# Patient Record
Sex: Female | Born: 1950
Health system: Southern US, Community
[De-identification: ages and names within clinical notes are randomized; demographics above are authoritative.]

## PROBLEM LIST (undated history)

## (undated) DIAGNOSIS — F329 Major depressive disorder, single episode, unspecified: Secondary | ICD-10-CM

## (undated) DIAGNOSIS — E7800 Pure hypercholesterolemia, unspecified: Secondary | ICD-10-CM

## (undated) DIAGNOSIS — F32A Depression, unspecified: Secondary | ICD-10-CM

## (undated) DIAGNOSIS — C541 Malignant neoplasm of endometrium: Secondary | ICD-10-CM

## (undated) HISTORY — PX: OTHER SURGICAL HISTORY: SHX169

## (undated) HISTORY — DX: Major depressive disorder, single episode, unspecified: F32.9

## (undated) HISTORY — DX: Depression, unspecified: F32.A

## (undated) HISTORY — DX: Malignant neoplasm of endometrium: C54.1

## (undated) HISTORY — DX: Pure hypercholesterolemia, unspecified: E78.00

---

## 2009-11-09 DIAGNOSIS — C541 Malignant neoplasm of endometrium: Secondary | ICD-10-CM

## 2009-11-09 HISTORY — PX: DILATION AND CURETTAGE OF UTERUS: SHX78

## 2009-11-09 HISTORY — PX: ABDOMINAL HYSTERECTOMY: SHX81

## 2009-11-09 HISTORY — DX: Malignant neoplasm of endometrium: C54.1

## 2010-07-23 ENCOUNTER — Ambulatory Visit: Admission: RE | Admit: 2010-07-23 | Discharge: 2010-07-23 | Payer: Self-pay | Admitting: Gynecologic Oncology

## 2010-08-05 ENCOUNTER — Ambulatory Visit (HOSPITAL_COMMUNITY): Admission: RE | Admit: 2010-08-05 | Discharge: 2010-08-07 | Payer: Self-pay | Admitting: Obstetrics & Gynecology

## 2010-08-05 ENCOUNTER — Encounter: Payer: Self-pay | Admitting: Obstetrics & Gynecology

## 2010-09-18 ENCOUNTER — Ambulatory Visit
Admission: RE | Admit: 2010-09-18 | Discharge: 2010-09-18 | Payer: Self-pay | Source: Home / Self Care | Admitting: Gynecologic Oncology

## 2010-11-09 DIAGNOSIS — E7800 Pure hypercholesterolemia, unspecified: Secondary | ICD-10-CM

## 2010-11-09 HISTORY — DX: Pure hypercholesterolemia, unspecified: E78.00

## 2011-01-22 LAB — DIFFERENTIAL
Basophils Absolute: 0 10*3/uL (ref 0.0–0.1)
Eosinophils Absolute: 0 10*3/uL (ref 0.0–0.7)
Eosinophils Relative: 0 % (ref 0–5)
Eosinophils Relative: 1 % (ref 0–5)
Lymphocytes Relative: 24 % (ref 12–46)
Lymphocytes Relative: 24 % (ref 12–46)
Lymphs Abs: 1.9 10*3/uL (ref 0.7–4.0)
Lymphs Abs: 2 10*3/uL (ref 0.7–4.0)
Lymphs Abs: 2.3 10*3/uL (ref 0.7–4.0)
Monocytes Absolute: 0.8 10*3/uL (ref 0.1–1.0)
Monocytes Relative: 9 % (ref 3–12)
Neutro Abs: 6.3 10*3/uL (ref 1.7–7.7)
Neutrophils Relative %: 68 % (ref 43–77)

## 2011-01-22 LAB — CBC
HCT: 37.1 % (ref 36.0–46.0)
Hemoglobin: 12.7 g/dL (ref 12.0–15.0)
Hemoglobin: 13.3 g/dL (ref 12.0–15.0)
MCH: 29.1 pg (ref 26.0–34.0)
MCH: 29.3 pg (ref 26.0–34.0)
MCH: 29.5 pg (ref 26.0–34.0)
MCHC: 33.9 g/dL (ref 30.0–36.0)
MCHC: 33.9 g/dL (ref 30.0–36.0)
MCHC: 34.2 g/dL (ref 30.0–36.0)
MCV: 85.8 fL (ref 78.0–100.0)
MCV: 86.4 fL (ref 78.0–100.0)
Platelets: 231 10*3/uL (ref 150–400)
Platelets: 249 10*3/uL (ref 150–400)
Platelets: 285 10*3/uL (ref 150–400)
RDW: 13.9 % (ref 11.5–15.5)
RDW: 13.9 % (ref 11.5–15.5)
RDW: 14.4 % (ref 11.5–15.5)
WBC: 9.5 10*3/uL (ref 4.0–10.5)

## 2011-01-22 LAB — TYPE AND SCREEN
ABO/RH(D): A POS
Antibody Screen: NEGATIVE

## 2011-01-22 LAB — BASIC METABOLIC PANEL
CO2: 28 mEq/L (ref 19–32)
Glucose, Bld: 133 mg/dL — ABNORMAL HIGH (ref 70–99)
Potassium: 4.1 mEq/L (ref 3.5–5.1)
Sodium: 141 mEq/L (ref 135–145)

## 2011-01-22 LAB — SURGICAL PCR SCREEN
MRSA, PCR: INVALID — AB
Staphylococcus aureus: INVALID — AB

## 2011-01-22 LAB — COMPREHENSIVE METABOLIC PANEL
AST: 24 U/L (ref 0–37)
Albumin: 3.4 g/dL — ABNORMAL LOW (ref 3.5–5.2)
Alkaline Phosphatase: 99 U/L (ref 39–117)
BUN: 9 mg/dL (ref 6–23)
CO2: 27 mEq/L (ref 19–32)
Calcium: 8.4 mg/dL (ref 8.4–10.5)
Calcium: 9 mg/dL (ref 8.4–10.5)
Creatinine, Ser: 0.68 mg/dL (ref 0.4–1.2)
GFR calc Af Amer: >60 mL/min (ref >60)
GFR calc non Af Amer: >60 mL/min (ref >60)
Potassium: 3.7 mEq/L (ref 3.5–5.1)
Total Protein: 6.3 g/dL (ref 6.0–8.3)
Total Protein: 7.3 g/dL (ref 6.0–8.3)

## 2011-02-08 IMAGING — CR DG CHEST 2V
2 series · 2 of 2 positions shown · non-contrast
Comparison: None.

CLINICAL DATA: Preoperative evaluation for endometrial carcinoma.
Nonsmoker.  No complaints

CHEST - 2 VIEW

[w chest pa]
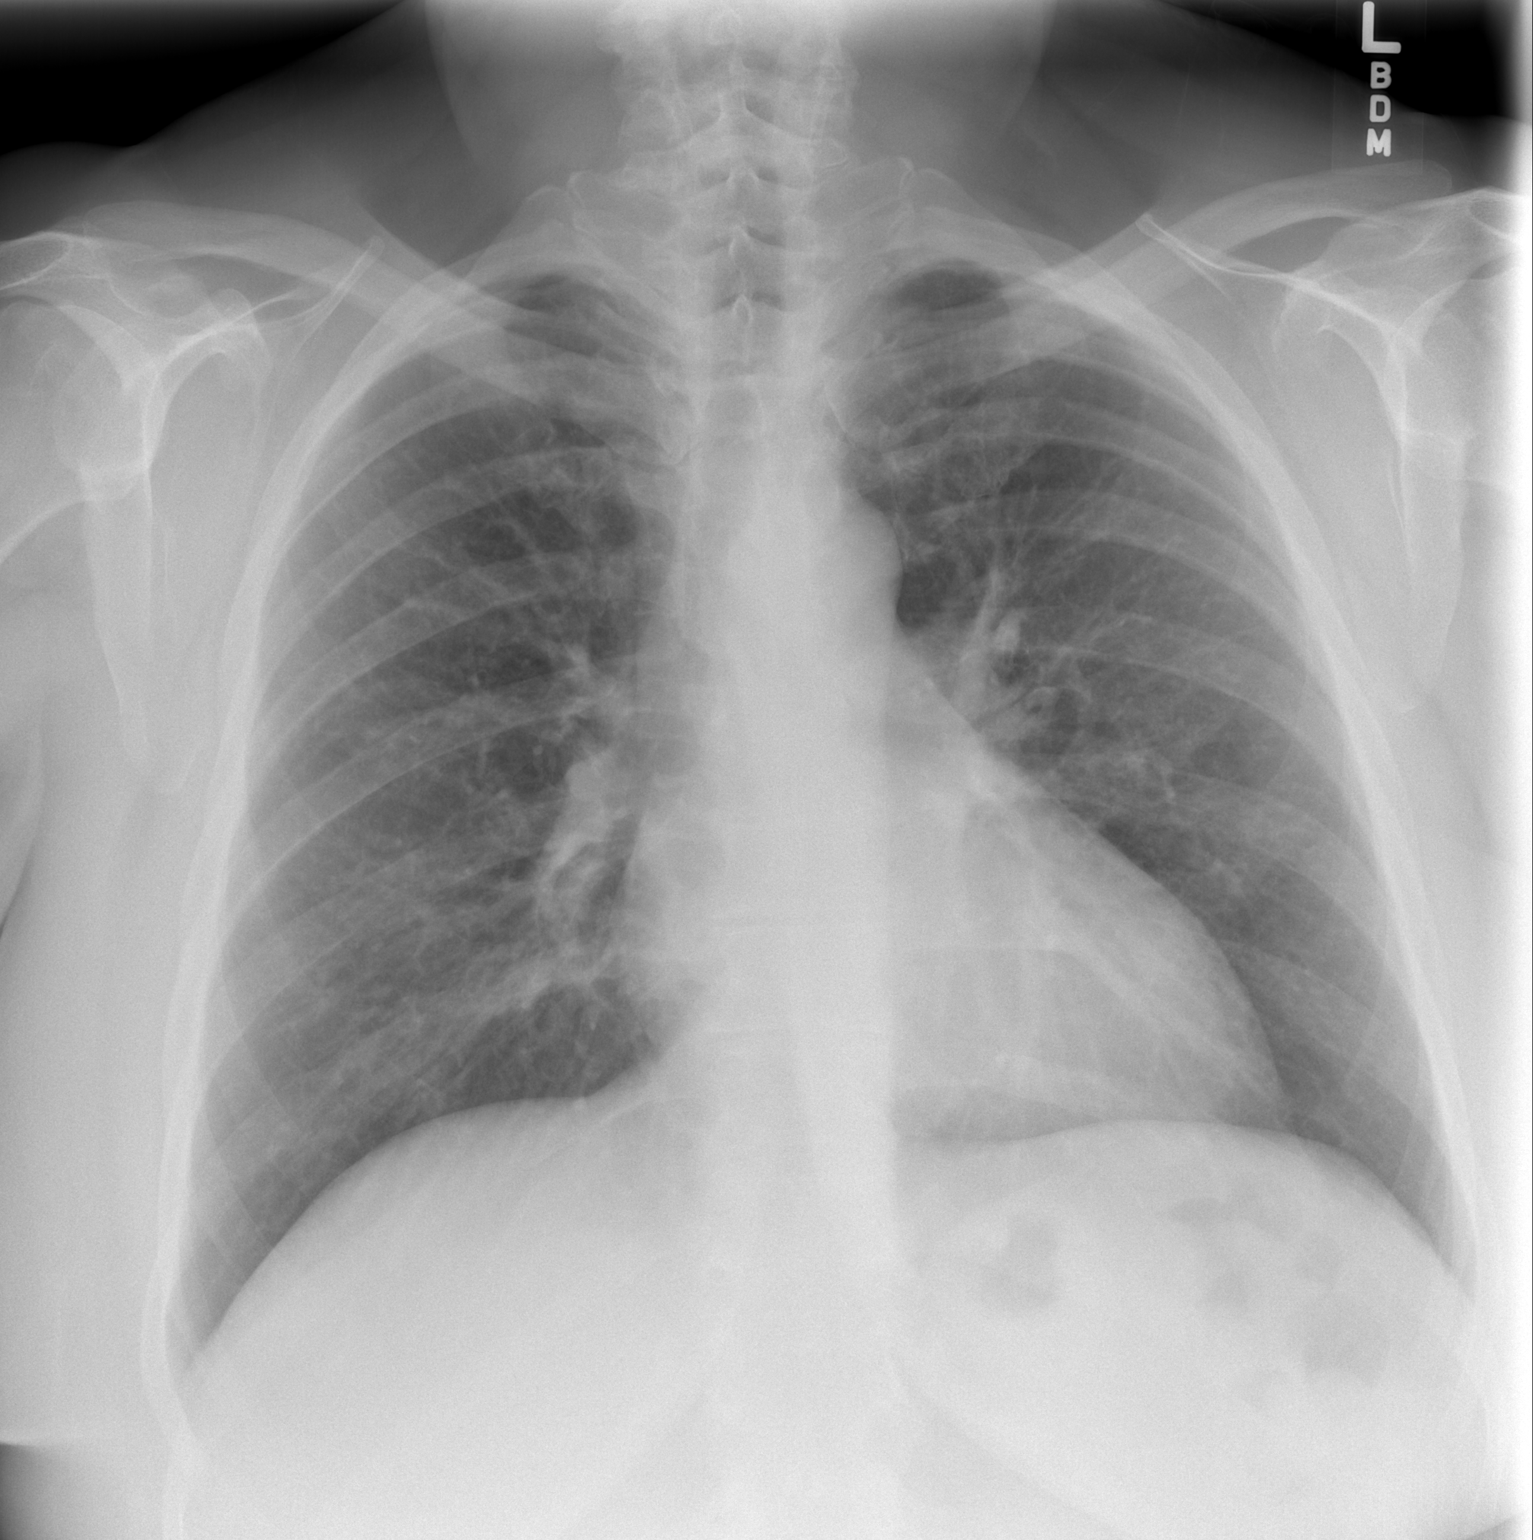

[w chest lat]
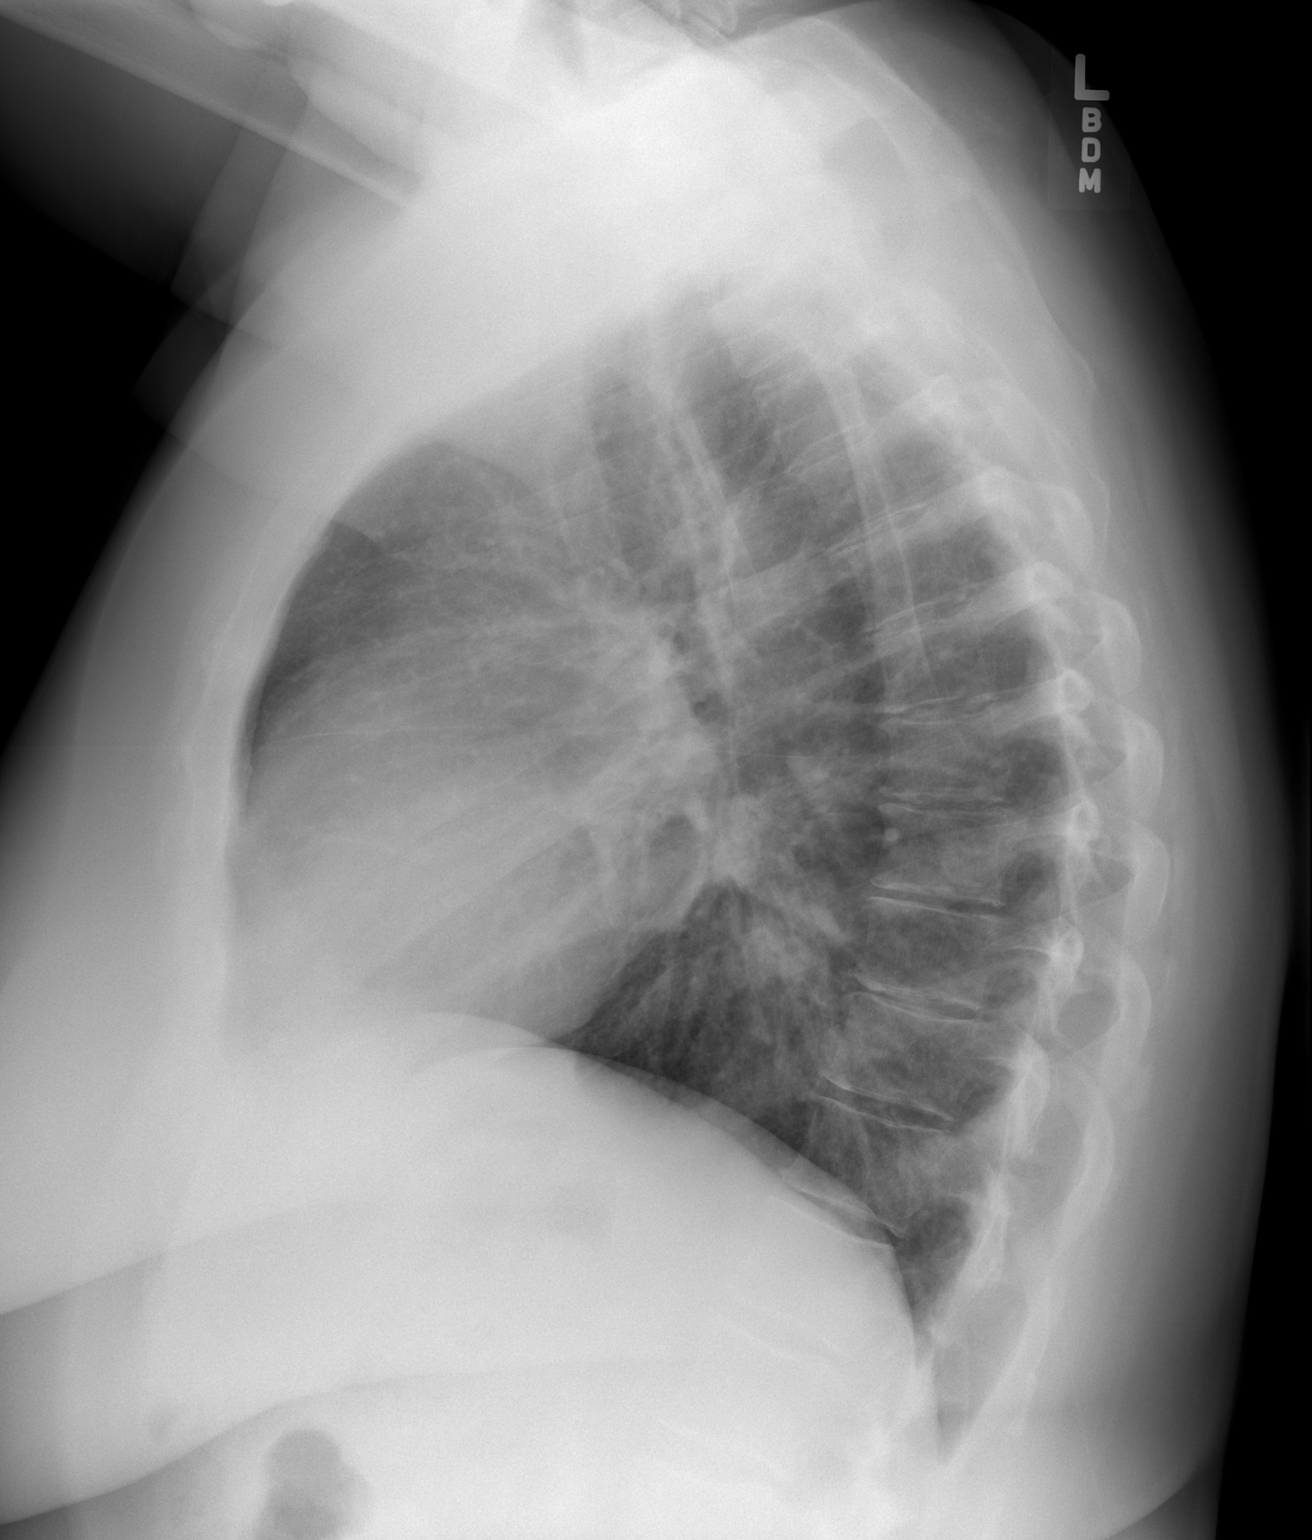

[2 of 2 positions shown; findings below may reference images not displayed]

FINDINGS: Heart size is within normal limits. Some prominence of
the central pulmonary vasculature is seen with some apical
pulmonary vascular redistribution and this can be seen with
pulmonary venous hypertension.  The lung fields appear clear with
no signs of focal infiltrate or congestive failure.  No pleural
effusion or peribronchial cuffing is seen.

Bony structures appear intact.
IMPRESSION: Findings suggesting the possibility of pulmonary venous
hypertension.  Otherwise unremarkable.

## 2011-04-08 ENCOUNTER — Other Ambulatory Visit: Payer: Self-pay | Admitting: Gynecologic Oncology

## 2011-04-08 ENCOUNTER — Ambulatory Visit: Payer: BC Managed Care – PPO | Attending: Gynecologic Oncology | Admitting: Gynecologic Oncology

## 2011-04-08 DIAGNOSIS — Z90710 Acquired absence of both cervix and uterus: Secondary | ICD-10-CM | POA: Insufficient documentation

## 2011-04-08 DIAGNOSIS — L918 Other hypertrophic disorders of the skin: Secondary | ICD-10-CM | POA: Insufficient documentation

## 2011-04-08 DIAGNOSIS — Z9079 Acquired absence of other genital organ(s): Secondary | ICD-10-CM | POA: Insufficient documentation

## 2011-04-08 DIAGNOSIS — C549 Malignant neoplasm of corpus uteri, unspecified: Secondary | ICD-10-CM | POA: Insufficient documentation

## 2011-04-09 NOTE — Consult Note (Signed)
NAME:  Hannah Barry, Hannah Barry                  ACCOUNT NO.:  [id number]  MEDICAL RECORD NO.:  [id number]           PATIENT TYPE:  O  LOCATION:  GYN                          FACILITY:  [facility]  PHYSICIAN:  Virgilene Stryker A. Duard Brady, MD    DATE OF BIRTH:  [birth year]  DATE OF CONSULTATION:  04/08/2011 DATE OF DISCHARGE:                                CONSULTATION   HISTORY:  Ms. Spoerl is a very pleasant 60 year old with stage IA1 endometrial carcinoma.  She presented to Dr. Leota Jacobsen with heavy postmenopausal bleeding in July 2011.  Ultrasound at that time revealed a 17-mm endometrial stripe thickness and she underwent a D and C which revealed a grade 1 tumor.  On August 05, 2010, she underwent robotic hysterectomy, BSO, lysis of adhesions and bilateral pelvic lymph node dissection.  Pathology was consistent with a grade 1 cancer with superficial myometrial invasion of 10% as it was 0.2 cm out of 2.5 cm in thickness; 13 lymph nodes were negative.  There was a benign endocervical polyp.  She was seen for her postoperative check in November 2011.  At that time, there was a separation of the vaginal cuff which was very slight, it was of the mucosa only and she was followed conservatively and did well.  She comes in today for her first cancer surveillance visit.  She is overall doing quite well and denies any complaints.  REVIEW OF SYSTEMS:  She denies any change in bowel or bladder habits; any nausea, vomiting, fevers, chills, chest pain, shortness of breath, vaginal bleeding, abdominal or pelvic pain.  FAMILY HISTORY:  Reviewed and there are no changes.  MEDICATIONS:  Over-the-counter allergy medications.  HEALTH MAINTENANCE:  She is due for mammogram in June.  PHYSICAL EXAMINATION:  VITAL SIGNS:  Weight 253 pounds which is up 5 pounds from her last visit, height 5 feet 7.5, blood pressure 112/72, pulse 70, respirations 16, temperature 98. GENERAL:  Well-nourished, well-developed female, in no acute  distress. NECK:  Supple.  There is no lymphadenopathy, no thyromegaly. LUNGS:  Clear to auscultation bilaterally. CARDIOVASCULAR EXAM:  Regular rate and rhythm. ABDOMEN:  Morbidly obese, soft, nontender, nondistended.  She has well- healed robotic incisions.  There is no evidence of any incisional hernias.  Groins are negative for adenopathy. EXTREMITIES:  There are no edema.  She has no chronic venous stasis changes. PELVIC:  External genitalia is within normal limits.  Vagina is atrophic.  The vaginal cuff, there is a small area of granulation tissue on the right side of the cuff.  After obtaining the patient's verbal consent, the area was biopsied without difficulty with a tissue biopsy forceps.  Hemostasis was obtained using silver nitrate.  Bimanual examination reveals no masses or nodularity.  ASSESSMENT:  The patient is a 60 year old with a stage IA, grade 1 endometrioid adenocarcinoma diagnosed and treated in September of 2011 who clinically has no evidence of recurrent disease.  I believe the granulation tissue is secondary to her superficial vaginal cuff separation.  PLAN: 1. Will follow up on results of biopsy report from today.  Will call  her with the results. 2. She will follow up with Dr. Leota Jacobsen in 6 months.  She will need a     Pap smear at that time.  Then return to see [place] in 1 year. 3. She will follow up with her other health care providers as     scheduled.     Alyzah Pelly A. Duard Brady, MD     PAG/MEDQ  D:  04/08/2011  T:  04/08/2011  Job:  [id number]  cc:   Telford Nab, R.N. [address] [place], [place] [address]  Hassell Done Fax: [phone]  Electronically Signed by Cleda Mccreedy MD on 04/09/2011 09:45:39 AM

## 2012-04-28 ENCOUNTER — Ambulatory Visit: Payer: BC Managed Care – PPO | Attending: Gynecologic Oncology | Admitting: Gynecologic Oncology

## 2012-04-28 ENCOUNTER — Encounter: Payer: Self-pay | Admitting: Gynecologic Oncology

## 2012-04-28 VITALS — BP 120/62 | HR 64 | Temp 98.0°F | Resp 16 | Ht 67.0 in | Wt 248.5 lb

## 2012-04-28 DIAGNOSIS — F329 Major depressive disorder, single episode, unspecified: Secondary | ICD-10-CM | POA: Insufficient documentation

## 2012-04-28 DIAGNOSIS — Z8542 Personal history of malignant neoplasm of other parts of uterus: Secondary | ICD-10-CM | POA: Insufficient documentation

## 2012-04-28 DIAGNOSIS — Z90710 Acquired absence of both cervix and uterus: Secondary | ICD-10-CM | POA: Insufficient documentation

## 2012-04-28 DIAGNOSIS — Z09 Encounter for follow-up examination after completed treatment for conditions other than malignant neoplasm: Secondary | ICD-10-CM | POA: Insufficient documentation

## 2012-04-28 DIAGNOSIS — F3289 Other specified depressive episodes: Secondary | ICD-10-CM | POA: Insufficient documentation

## 2012-04-28 DIAGNOSIS — C541 Malignant neoplasm of endometrium: Secondary | ICD-10-CM

## 2012-04-28 NOTE — Progress Notes (Signed)
Consult Note: Gyn-Onc  Hannah Barry 61 y.o. female  CC:  Chief Complaint  Patient presents with  . Endometrial cancer    Follow up    HPI: Hannah Barry is a very pleasant 61 year old with stage IA1 endometrial carcinoma. She presented to Dr. Leota Jacobsen with heavy postmenopausal bleeding in July 2011. Ultrasound at that time revealed a 17-mm endometrial stripe thickness and she underwent a D and C which revealed a grade 1 tumor. On August 05, 2010, she underwent robotic hysterectomy, BSO, lysis of adhesions and bilateral pelvic lymph node dissection. Pathology was consistent with a grade 1 cancer with superficial myometrial invasion of 10% as it was 0.2 cm out of 2.5 cm in thickness; 13 lymph nodes were negative. There was a benign endocervical polyp. She was seen for her postoperative check in November 2011. At that time, there was a separation of the vaginal cuff which was very slight, it was of the mucosa only and she was followed conservatively and did well. She comes in today for her cancer surveillance visit. She is overall doing quite well and denies any complaints.   REVIEW OF SYSTEMS: She denies any change in bowel or bladder habits; any nausea, vomiting, fevers, chills, chest pain, shortness of breath, vaginal bleeding, abdominal or pelvic pain. 10 point review of systems is negative.  FAMILY HISTORY: Reviewed and there are no changes. Her brother committed suicide last year.   HEALTH MAINTENANCE: She had her mammogram with Dr. Leota Jacobsen last year.  Interval History: She had some issues with depression last year in the setting of her diagnosis, her brother committing suicide, and her daughter getting divorced. Her daughter moved in with them.  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 She started taking antidepressants really has been feeling much better. She's also walking her dogs regularly  And that also helps to make her feel better. She states that compared to last her she's  feeling exceptionally well.   Current Meds:  Outpatient Encounter Prescriptions as of 04/28/2012  Medication Sig Dispense Refill  . FLUoxetine (PROZAC) 40 MG capsule Take 40 mg by mouth daily.        Allergy: No Known Allergies  Social Hx:   History   Social History  . Marital Status: Married    Spouse Name: N/A    Number of Children: N/A  . Years of Education: N/A   Occupational History  . Not on file.   Social History Main Topics  . Smoking status: Never Smoker   . Smokeless tobacco: Not on file  . Alcohol Use: No  . Drug Use: No  . Sexually Active: No   Other Topics Concern  . Not on file   Social History Narrative  . No narrative on file    Past Surgical Hx:  Past Surgical History  Procedure Date  . Abdominal hysterectomy 2011    Robotic hyst BSO, lysis of adhesions, & LND  . Dilation and curettage of uterus 2011  . Hysteroscopy     Past Medical Hx:  Past Medical History  Diagnosis Date  . Endometrial cancer 2011  . Depression   . Hypercholesterolemia 2012    Family Hx: No family history on file.  Vitals:  Blood pressure 120/62, pulse 64, temperature 98 F (36.7 C), temperature source Oral, resp. rate 16, height 5\' 7"  (1.702 m), weight 248 lb 8 oz (112.719 kg).  Physical Exam:  GENERAL: Well-nourished, well-developed female, in no acute distress.  NECK: Supple. There is  no lymphadenopathy, no thyromegaly.   LUNGS: Clear to auscultation bilaterally.   CARDIOVASCULAR EXAM: Regular rate and rhythm.   ABDOMEN: Morbidly obese, soft, nontender, nondistended. She has well- healed robotic incisions. There is no evidence of any incisional hernias. Groins are negative for adenopathy. Slight erythema from tick bite near right groin, no purulence.  EXTREMITIES: There are no edema. She has no chronic venous stasis changes.   PELVIC: External genitalia is within normal limits. Vagina is atrophic. The vaginal cuff has no lesions. Bimanual examination  reveals no masses or nodularity. Rectal confirms.    Assessment/Plan: The patient is a 61 year old with a stage IA, grade 1 endometrioid adenocarcinoma diagnosed and treated in September of 2011 who clinically has no evidence of recurrent disease.   1. She will follow up with Dr. Leota Jacobsen in 6 months. She will need a Pap smear at that time. Then return to see [place] in 1 year.  2. She will follow up with her other health care providers as scheduled.   Masayoshi Couzens A., MD 04/28/2012, 11:48 AM

## 2012-04-28 NOTE — Patient Instructions (Signed)
RTC 1 year and follow up with Dr. Leota Jacobsen in 6 months.

## 2013-06-22 ENCOUNTER — Ambulatory Visit: Payer: BC Managed Care – PPO | Attending: Gynecologic Oncology | Admitting: Gynecologic Oncology

## 2013-06-22 ENCOUNTER — Encounter: Payer: Self-pay | Admitting: Gynecologic Oncology

## 2013-06-22 VITALS — BP 110/70 | HR 78 | Temp 98.2°F | Resp 18 | Ht 67.5 in | Wt 252.0 lb

## 2013-06-22 DIAGNOSIS — C541 Malignant neoplasm of endometrium: Secondary | ICD-10-CM

## 2013-06-22 NOTE — Progress Notes (Signed)
Consult Note: Gyn-Onc  Hannah Barry 62 y.o. female  CC:  Chief Complaint  Patient presents with  . Endometrial Cancer    Follow up    HPI: Hannah Barry is a very pleasant 62 year old with stage IA1 endometrial carcinoma. She presented to Dr. Leota Jacobsen with heavy postmenopausal bleeding in July 2011. Ultrasound at that time revealed a 17-mm endometrial stripe thickness and she underwent a D and C which revealed a grade 1 tumor. On August 05, 2010, she underwent robotic hysterectomy, BSO, lysis of adhesions and bilateral pelvic lymph node dissection. Pathology was consistent with a grade 1 cancer with superficial myometrial invasion of 10% as it was 0.2 cm out of 2.5 cm in thickness; 13 lymph nodes were negative. There was a benign endocervical polyp. She comes in today for her cancer surveillance visit. I last saw her 6/13. She is overall doing quite well and denies any complaints.   REVIEW OF SYSTEMS: She denies any change in bowel or bladder habits; any nausea, vomiting, fevers, chills, chest pain, shortness of breath, vaginal bleeding, abdominal or pelvic pain. 10 point review of systems is negative. She had one episode of pink discharge after voiding. She did have some UTI symptoms she did not take care for that. She's had no bleeding or UTI symptoms since that time. She was placed on a cholesterol medication she does not know how high your cholesterol was. She does walk three quarters of a mile 3 times a day but does feel that she can do better with diet and exercise.  FAMILY HISTORY: Reviewed and there are no changes. Her daughter was married this year.   HEALTH MAINTENANCE: She had her mammogram with Dr. Leota Jacobsen last year.  Interval History: She had some issues with depression last year in the setting of her diagnosis, her brother committing suicide, and her daughter getting divorced.   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 She started taking antidepressants really has been  feeling much better. She's also walking her dogs regularly  And that also helps to make her feel better. She states that compared to last her she's feeling exceptionally well.  She is taking her antidepressants and her stress level is much better. Last year she had stress regarding her daughter getting divorced but this year her daughter has gotten remarried and has been a positive change.   Current Meds:  Outpatient Encounter Prescriptions as of 06/22/2013  Medication Sig Dispense Refill  . atorvastatin (LIPITOR) 10 MG tablet Take 10 mg by mouth daily.      Marland Kitchen FLUoxetine (PROZAC) 40 MG capsule Take 40 mg by mouth daily.      . Multiple Vitamin (DAILY MULTIVITAMIN PO) Take by mouth daily.       No facility-administered encounter medications on file as of 06/22/2013.    Allergy: No Known Allergies  Social Hx:   History   Social History  . Marital Status: Married    Spouse Name: N/A    Number of Children: N/A  . Years of Education: N/A   Occupational History  . Not on file.   Social History Main Topics  . Smoking status: Never Smoker   . Smokeless tobacco: Not on file  . Alcohol Use: No  . Drug Use: No  . Sexual Activity: No   Other Topics Concern  . Not on file   Social History Narrative  . No narrative on file    Past Surgical Hx:  Past Surgical History  Procedure  Laterality Date  . Abdominal hysterectomy  2011    Robotic hyst BSO, lysis of adhesions, & LND  . Dilation and curettage of uterus  2011  . Hysteroscopy      Past Medical Hx:  Past Medical History  Diagnosis Date  . Endometrial cancer 2011  . Depression   . Hypercholesterolemia 2012    Family Hx:  Family History  Problem Relation Age of Onset  . Failure to thrive Father   . Heart disease Sister     Vitals:  Blood pressure 110/70, pulse 78, temperature 98.2 F (36.8 C), resp. rate 18, height 5' 7.5" (1.715 m), weight 252 lb (114.306 kg).  Physical Exam:  GENERAL: Well-nourished,  well-developed female, in no acute distress.  NECK: Supple. There is no lymphadenopathy, no thyromegaly.   LUNGS: Clear to auscultation bilaterally.   CARDIOVASCULAR EXAM: Regular rate and rhythm.   ABDOMEN: Morbidly obese, soft, nontender, nondistended. She has well- healed robotic incisions. There is no evidence of any incisional hernias. Groins are negative for adenopathy. Slight erythema from tick bite near right groin, no purulence.  EXTREMITIES: There are no edema. She has no chronic venous stasis changes.   PELVIC: External genitalia is within normal limits. Vagina is atrophic. The vaginal cuff has no lesions. Bimanual examination reveals no masses or nodularity. Rectal confirms.    Assessment/Plan: 62 year old with a stage IA grade 1 endometrial carcinoma status post definitive surgery September 2011. She has no evidence of recurrent disease.   1. She will follow up with Dr. Leota Jacobsen in 6 months. She will need a Pap smear at that time. Then return to see [place] in 1 year.  2. She will follow up with her other health care providers as scheduled.   Carlson Belland A., MD 06/22/2013, 9:44 AM

## 2013-06-22 NOTE — Patient Instructions (Signed)
Return to clinic in 1 year. Followup with Dr. Leota Jacobsen in 6 months.

## 2014-08-30 ENCOUNTER — Encounter: Payer: Self-pay | Admitting: Gynecologic Oncology

## 2014-08-30 ENCOUNTER — Ambulatory Visit: Payer: BC Managed Care – PPO | Attending: Gynecologic Oncology | Admitting: Gynecologic Oncology

## 2014-08-30 VITALS — BP 116/65 | HR 87 | Temp 98.2°F | Resp 18 | Wt 259.3 lb

## 2014-08-30 DIAGNOSIS — Z8542 Personal history of malignant neoplasm of other parts of uterus: Secondary | ICD-10-CM

## 2014-08-30 DIAGNOSIS — C541 Malignant neoplasm of endometrium: Secondary | ICD-10-CM

## 2014-08-30 NOTE — Patient Instructions (Signed)
Follow up with Dr. Marin Roberts in 6 months and return to see Dr. Alycia Rossetti in one year. That will be your 5-year anniversary and your last visit with [facility].

## 2014-08-30 NOTE — Progress Notes (Signed)
Consult Note: Gyn-Onc  Hannah Barry 63 y.o. female  CC:  Chief Complaint  Patient presents with  . Endometrial Cancer    HPI: Hannah Barry is a very pleasant 63 year old with stage IA1 endometrial carcinoma. She presented to Dr. Marin Roberts with heavy postmenopausal bleeding in July 2011. Ultrasound at that time revealed a 17-mm endometrial stripe thickness and she underwent a D and C which revealed a grade 1 tumor. On August 05, 2010, she underwent robotic hysterectomy, BSO, lysis of adhesions and bilateral pelvic lymph node dissection. Pathology was consistent with a grade 1 cancer with superficial myometrial invasion of 10% as it was 0.2 cm out of 2.5 cm in thickness; 13 lymph nodes were negative. There was a benign endocervical polyp. She comes in today for her cancer surveillance visit.  I last saw her 8/14. She is overall doing quite well and denies any complaints.  She states that she is doing very well and is more upbeat today than I have ever seen her. Her daughter is expecting her first grandchild (boy) in early December.  There are no new medical problems in the family.  REVIEW OF SYSTEMS:  Constitutional:  Denies fever. Weight gain 7# Skin: No rash, sores, jaundice, itching, or dryness.  Cardiovascular: No chest pain, shortness of breath, or edema  Pulmonary: + "constant"cough.  She states that she has discussed this with her primary physician and he is going to work it up if it does not resolve soon.  She states that she does have allergies and feels some post nasal drip. It is non productive  Gastro Intestinal:  No nausea, vomiting, constipation, or diarrhea reported. No bright red blood per rectum or change in bowel movement.  Genitourinary: Denies vaginal bleeding and discharge.  Musculoskeletal: Not exercising, stays very active at work Neurologic: No weakness, numbness  Current Meds:  Outpatient Encounter Prescriptions as of 08/30/2014  Medication Sig  . Multiple Vitamin  (DAILY MULTIVITAMIN PO) Take by mouth daily.  Marland Kitchen venlafaxine XR (EFFEXOR-XR) 150 MG 24 hr capsule   . [DISCONTINUED] atorvastatin (LIPITOR) 10 MG tablet Take 10 mg by mouth daily.  . [DISCONTINUED] FLUoxetine (PROZAC) 40 MG capsule Take 40 mg by mouth daily.    Allergy: No Known Allergies  Social Hx:   History   Social History  . Marital Status: Married    Spouse Name: N/A    Number of Children: N/A  . Years of Education: N/A   Occupational History  . Not on file.   Social History Main Topics  . Smoking status: Never Smoker   . Smokeless tobacco: Not on file  . Alcohol Use: No  . Drug Use: No  . Sexual Activity: No   Other Topics Concern  . Not on file   Social History Narrative  . No narrative on file    Past Surgical Hx:  Past Surgical History  Procedure Laterality Date  . Abdominal hysterectomy  2011    Robotic hyst BSO, lysis of adhesions, & LND  . Dilation and curettage of uterus  2011  . Hysteroscopy      Past Medical Hx:  Past Medical History  Diagnosis Date  . Endometrial cancer 2011  . Depression   . Hypercholesterolemia 2012    Family Hx:  Family History  Problem Relation Age of Onset  . Failure to thrive Father   . Heart disease Sister     Vitals:  Blood pressure 116/65, pulse 87, temperature 98.2 F (36.8 C), temperature source Oral, resp.  rate 18, weight 259 lb 4.8 oz (117.618 kg).  Physical Exam:  GENERAL: Well-nourished, well-developed female, in no acute distress.  NECK: Supple. There is no lymphadenopathy, no thyromegaly.   LUNGS: Clear to auscultation bilaterally.   CARDIOVASCULAR EXAM: Regular rate and rhythm.   ABDOMEN: Morbidly obese, soft, nontender, nondistended. She has well- healed robotic incisions. There is no evidence of any incisional hernias. Groins are negative for adenopathy.  EXTREMITIES: There is no edema. She has no chronic venous stasis changes.   PELVIC: External genitalia is within normal limits. Vagina is  atrophic. The vaginal cuff has no lesions. Bimanual examination reveals no masses or nodularity. Rectal confirms. Exam is somewhat limited by habitus.   Assessment/Plan: 63 year old with a stage IA grade 1 endometrial carcinoma status post definitive surgery September 2011. She has no evidence of recurrent disease.   1. She will follow up with Dr. Marin Roberts in 6 months. She will need a Pap smear at that time. Then return to see [place] in 1 year.  2. She will follow up with her other health care providers as scheduled.   Artrell Lawless A., MD 08/30/2014, 2:55 PM

## 2014-12-21 ENCOUNTER — Encounter: Payer: Self-pay | Admitting: Vascular Surgery

## 2014-12-21 ENCOUNTER — Other Ambulatory Visit: Payer: Self-pay | Admitting: *Deleted

## 2014-12-21 DIAGNOSIS — I82811 Embolism and thrombosis of superficial veins of right lower extremities: Secondary | ICD-10-CM

## 2014-12-31 ENCOUNTER — Encounter: Payer: Self-pay | Admitting: Vascular Surgery

## 2015-01-01 ENCOUNTER — Ambulatory Visit (INDEPENDENT_AMBULATORY_CARE_PROVIDER_SITE_OTHER): Payer: BLUE CROSS/BLUE SHIELD | Admitting: Vascular Surgery

## 2015-01-01 ENCOUNTER — Ambulatory Visit (HOSPITAL_COMMUNITY)
Admission: RE | Admit: 2015-01-01 | Discharge: 2015-01-01 | Disposition: A | Discharge status: Home / Self Care | Payer: BLUE CROSS/BLUE SHIELD | Source: Ambulatory Visit | Attending: Vascular Surgery | Admitting: Vascular Surgery

## 2015-01-01 ENCOUNTER — Encounter: Payer: Self-pay | Admitting: Vascular Surgery

## 2015-01-01 VITALS — BP 115/81 | HR 86 | Resp 18 | Ht 67.0 in | Wt 260.3 lb

## 2015-01-01 DIAGNOSIS — I8310 Varicose veins of unspecified lower extremity with inflammation: Secondary | ICD-10-CM | POA: Insufficient documentation

## 2015-01-01 DIAGNOSIS — I8311 Varicose veins of right lower extremity with inflammation: Secondary | ICD-10-CM | POA: Insufficient documentation

## 2015-01-01 DIAGNOSIS — I82811 Embolism and thrombosis of superficial veins of right lower extremities: Secondary | ICD-10-CM

## 2015-01-01 NOTE — Progress Notes (Signed)
Patient name: Hannah Barry MRN: [id number] DOB: 1951/09/12 Sex: female   Referred by: Lin Landsman  Reason for referral:  Chief Complaint  Patient presents with  . Varicose Veins    referred by Dr. Lin Landsman , hx of right anterior saphenous vein thrombophlebitis for 1 month    . New Evaluation    HISTORY OF PRESENT ILLNESS: Patient resents today for evaluation of right leg venous pathology. She is active 64 year old female. She reports a many year history of a large plexus of varicosities over her anterior thigh on the right extending to the lateral knee. In December she had an episode where these became hard inflamed and tender. This initially was treated with a symptomatic Care. She then had some flareup of this and had the venous duplex which showed clotting in her anterior accessory branch of her saphenous vein going to the saphenofemoral junction. There was no DVT. She felt that there was some suggestion that she may have calf vein DVT. I have the report from her duplex at [facility] and this specifically says that she does not have any evidence of calf vein thrombus. She has been on Xarelto for approximately 3 weeks. She has no prior history of venous embolic disease. Has a history of endometrial cancer. Otherwise healthy.  Past Medical History  Diagnosis Date  . Endometrial cancer 2011  . Depression   . Hypercholesterolemia 2012    Past Surgical History  Procedure Laterality Date  . Abdominal hysterectomy  2011    Robotic hyst BSO, lysis of adhesions, & LND  . Dilation and curettage of uterus  2011  . Hysteroscopy    . Removal 67f breast cyst Right age 74  . Removal of cysts from bilateral feet Bilateral age 16    History   Social History  . Marital Status: Married    Spouse Name: N/A  . Number of Children: N/A  . Years of Education: N/A   Occupational History  . Not on file.   Social History Main Topics  . Smoking status: Never Smoker   . Smokeless tobacco:  Not on file  . Alcohol Use: No  . Drug Use: No  . Sexual Activity: No   Other Topics Concern  . Not on file   Social History Narrative    Family History  Problem Relation Age of Onset  . Failure to thrive Father   . Heart disease Sister     Allergies as of 01/01/2015  . (No Known Allergies)    Current Outpatient Prescriptions on File Prior to Visit  Medication Sig Dispense Refill  . atorvastatin (LIPITOR) 10 MG tablet Take 10 mg by mouth daily.    . Multiple Vitamin (DAILY MULTIVITAMIN PO) Take by mouth daily.    . rivaroxaban (XARELTO) 20 MG TABS tablet Take 20 mg by mouth daily with supper.    . venlafaxine XR (EFFEXOR-XR) 150 MG 24 hr capsule     . albuterol (PROVENTIL HFA;VENTOLIN HFA) 108 (90 BASE) MCG/ACT inhaler Inhale 2 puffs into the lungs every 4 (four) hours as needed for wheezing or shortness of breath.     No current facility-administered medications on file prior to visit.     REVIEW OF SYSTEMS:  Positives indicated with an "X"  CARDIOVASCULAR:  [ ]  chest pain   [ ]  chest pressure   [ ]  palpitations   [ ]  orthopnea   [ ]  dyspnea on exertion   [ ]  claudication   [ ]  rest  pain   [ ]  DVT   [ ]  phlebitis PULMONARY:   [x ] productive cough   [ ]  asthma   [ ]  wheezing NEUROLOGIC:   [ ]  weakness  [ ]  paresthesias  [ ]  aphasia  [ ]  amaurosis  [ ]  dizziness HEMATOLOGIC:   [ ]  bleeding problems   [ ]  clotting disorders MUSCULOSKELETAL:  [ ]  joint pain   [ ]  joint swelling GASTROINTESTINAL: [ ]   blood in stool  [ ]   hematemesis GENITOURINARY:  [ ]   dysuria  [ ]   hematuria PSYCHIATRIC:  [ ]  history of major depression INTEGUMENTARY:  [ ]  rashes  [ ]  ulcers CONSTITUTIONAL:  [ ]  fever   [ ]  chills  PHYSICAL EXAMINATION:  General: The patient is a well-nourished female, in no acute distress. Obese Vital signs are BP 115/81 mmHg  Pulse 86  Resp 18  Ht 5\' 7"  (1.702 m)  Wt 260 lb 4.8 oz (118.071 kg)  BMI 40.76 kg/m2 Pulmonary: There is a good air exchange    Musculoskeletal: There are no major deformities.  There is no significant extremity pain. Neurologic: No focal weakness or paresthesias are detected, Skin: There are no ulcer or rashes noted. Psychiatric: The patient has normal affect. Cardiovascular: Palpable dorsalis pedis pulses bilaterally She does have a very large plexus of thrombosed varicosities over her anterior thigh extending into her lateral knee. I reimage these areas with SonoSite ultrasound. This does show thrombosis of the varicosities and also a very large anterior accessory branch. Her true saphenous vein is quite small to her thigh.   [facility] Vascular Lab Studies:  Ordered and Independently Reviewed formal duplex shows no evidence of DVT. Does show thrombus in her anterior accessory branch  Impression and Plan:  Superficial thrombophlebitis in the large varicosities on the anterior thigh from a large anterior accessory branch. Discussed this at length with the patient. Explained that this does not put her any particular increased risk for DVT. This does extend up to the saphenofemoral junction. I would agree with a short course of anticoagulation. I would for comfortable discontinuing this at the end of 3 months. I did explain that she may completely resolve this with time. I explained that after the resolution that she may have patency of the varicosities again with this could eschar and sclerosis down with essentially self treating her varicosities. She had not had pain in the past would not recommend any other treatment unless she has progressive symptoms. She was relieved this discussion will see [place] again on as-needed basis    Sufyaan Palma [facility] Office: [phone]

## 2015-01-08 ENCOUNTER — Encounter: Payer: Self-pay | Admitting: Family Medicine

## 2015-02-26 ENCOUNTER — Telehealth: Payer: Self-pay | Admitting: *Deleted

## 2015-02-26 NOTE — Telephone Encounter (Signed)
Returned Ms. Lungren earlier phone call.  Unable to talk with Ms. Pangle but left detailed telephone message (cell phone).  Ms. Bonebrake saw Dr. Donnetta Hutching on 01-01-2015 for superficial thrombophlebitis right anterior thigh (anterior accessory branch) and she had already been on Xarelto for 3 weeks prior to visit.  Ms. 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  She has been in touch with her primary care physician, Dr. Lin Landsman, who asked that she discuss this plan with Dr. Donnetta Hutching.  Dr. Donnetta Hutching reviewed her office note and venous reflux study from 01-01-2015 and stated that it was OK to stop Xarelto and continue Asprin 81 mg daily. Relayed Dr. Luther Parody recommendation to Ms. Caldeira via detailed voice message and asked that she call [facility] if she had any questions or concerns.

## 2018-08-24 DIAGNOSIS — Z6841 Body Mass Index (BMI) 40.0 and over, adult: Secondary | ICD-10-CM | POA: Diagnosis not present

## 2018-08-24 DIAGNOSIS — J329 Chronic sinusitis, unspecified: Secondary | ICD-10-CM | POA: Diagnosis not present

## 2018-08-24 DIAGNOSIS — H6090 Unspecified otitis externa, unspecified ear: Secondary | ICD-10-CM | POA: Diagnosis not present

## 2018-08-24 DIAGNOSIS — H66003 Acute suppurative otitis media without spontaneous rupture of ear drum, bilateral: Secondary | ICD-10-CM | POA: Diagnosis not present

## 2018-09-19 DIAGNOSIS — Z23 Encounter for immunization: Secondary | ICD-10-CM | POA: Diagnosis not present

## 2018-09-20 DIAGNOSIS — Z1231 Encounter for screening mammogram for malignant neoplasm of breast: Secondary | ICD-10-CM | POA: Diagnosis not present

## 2018-10-03 DIAGNOSIS — M25512 Pain in left shoulder: Secondary | ICD-10-CM | POA: Diagnosis not present

## 2018-10-12 DIAGNOSIS — Z6841 Body Mass Index (BMI) 40.0 and over, adult: Secondary | ICD-10-CM | POA: Diagnosis not present

## 2018-10-12 DIAGNOSIS — M25512 Pain in left shoulder: Secondary | ICD-10-CM | POA: Diagnosis not present

## 2018-10-13 DIAGNOSIS — M6281 Muscle weakness (generalized): Secondary | ICD-10-CM | POA: Diagnosis not present

## 2018-10-13 DIAGNOSIS — M25512 Pain in left shoulder: Secondary | ICD-10-CM | POA: Diagnosis not present

## 2018-10-13 DIAGNOSIS — R293 Abnormal posture: Secondary | ICD-10-CM | POA: Diagnosis not present

## 2018-10-13 DIAGNOSIS — M2560 Stiffness of unspecified joint, not elsewhere classified: Secondary | ICD-10-CM | POA: Diagnosis not present

## 2018-10-13 DIAGNOSIS — M79622 Pain in left upper arm: Secondary | ICD-10-CM | POA: Diagnosis not present

## 2018-10-17 DIAGNOSIS — M7582 Other shoulder lesions, left shoulder: Secondary | ICD-10-CM | POA: Diagnosis not present

## 2018-11-21 DIAGNOSIS — S46012A Strain of muscle(s) and tendon(s) of the rotator cuff of left shoulder, initial encounter: Secondary | ICD-10-CM | POA: Diagnosis not present

## 2018-11-21 DIAGNOSIS — M19012 Primary osteoarthritis, left shoulder: Secondary | ICD-10-CM | POA: Diagnosis not present

## 2020-01-29 DIAGNOSIS — Z01419 Encounter for gynecological examination (general) (routine) without abnormal findings: Secondary | ICD-10-CM | POA: Diagnosis not present

## 2020-02-05 DIAGNOSIS — R0989 Other specified symptoms and signs involving the circulatory and respiratory systems: Secondary | ICD-10-CM | POA: Diagnosis not present

## 2020-02-05 DIAGNOSIS — R5383 Other fatigue: Secondary | ICD-10-CM | POA: Diagnosis not present

## 2020-02-05 DIAGNOSIS — Z0000 Encounter for general adult medical examination without abnormal findings: Secondary | ICD-10-CM | POA: Diagnosis not present

## 2020-02-05 DIAGNOSIS — Z6841 Body Mass Index (BMI) 40.0 and over, adult: Secondary | ICD-10-CM | POA: Diagnosis not present

## 2020-02-05 DIAGNOSIS — Z9181 History of falling: Secondary | ICD-10-CM | POA: Diagnosis not present

## 2020-02-05 DIAGNOSIS — Z79899 Other long term (current) drug therapy: Secondary | ICD-10-CM | POA: Diagnosis not present

## 2020-02-05 DIAGNOSIS — E7800 Pure hypercholesterolemia, unspecified: Secondary | ICD-10-CM | POA: Diagnosis not present

## 2020-02-05 DIAGNOSIS — M810 Age-related osteoporosis without current pathological fracture: Secondary | ICD-10-CM | POA: Diagnosis not present

## 2020-02-19 DIAGNOSIS — I6523 Occlusion and stenosis of bilateral carotid arteries: Secondary | ICD-10-CM | POA: Diagnosis not present

## 2020-02-19 DIAGNOSIS — R0989 Other specified symptoms and signs involving the circulatory and respiratory systems: Secondary | ICD-10-CM | POA: Diagnosis not present

## 2020-08-05 DIAGNOSIS — Z23 Encounter for immunization: Secondary | ICD-10-CM | POA: Diagnosis not present

## 2020-08-15 DIAGNOSIS — M1712 Unilateral primary osteoarthritis, left knee: Secondary | ICD-10-CM | POA: Diagnosis not present

## 2020-10-30 DIAGNOSIS — Z1231 Encounter for screening mammogram for malignant neoplasm of breast: Secondary | ICD-10-CM | POA: Diagnosis not present

## 2021-01-30 DIAGNOSIS — Z01419 Encounter for gynecological examination (general) (routine) without abnormal findings: Secondary | ICD-10-CM | POA: Diagnosis not present

## 2021-02-06 DIAGNOSIS — Z1331 Encounter for screening for depression: Secondary | ICD-10-CM | POA: Diagnosis not present

## 2021-02-06 DIAGNOSIS — I82811 Embolism and thrombosis of superficial veins of right lower extremities: Secondary | ICD-10-CM | POA: Diagnosis not present

## 2021-02-06 DIAGNOSIS — M1990 Unspecified osteoarthritis, unspecified site: Secondary | ICD-10-CM | POA: Diagnosis not present

## 2021-02-06 DIAGNOSIS — E7800 Pure hypercholesterolemia, unspecified: Secondary | ICD-10-CM | POA: Diagnosis not present

## 2021-02-06 DIAGNOSIS — Z6841 Body Mass Index (BMI) 40.0 and over, adult: Secondary | ICD-10-CM | POA: Diagnosis not present

## 2021-02-06 DIAGNOSIS — Z9181 History of falling: Secondary | ICD-10-CM | POA: Diagnosis not present

## 2021-02-06 DIAGNOSIS — Z79899 Other long term (current) drug therapy: Secondary | ICD-10-CM | POA: Diagnosis not present

## 2021-02-06 DIAGNOSIS — Z0000 Encounter for general adult medical examination without abnormal findings: Secondary | ICD-10-CM | POA: Diagnosis not present

## 2021-02-16 DIAGNOSIS — J069 Acute upper respiratory infection, unspecified: Secondary | ICD-10-CM | POA: Diagnosis not present

## 2021-12-09 DIAGNOSIS — L219 Seborrheic dermatitis, unspecified: Secondary | ICD-10-CM | POA: Diagnosis not present

## 2022-02-02 DIAGNOSIS — Z01419 Encounter for gynecological examination (general) (routine) without abnormal findings: Secondary | ICD-10-CM | POA: Diagnosis not present

## 2022-02-10 DIAGNOSIS — R5383 Other fatigue: Secondary | ICD-10-CM | POA: Diagnosis not present

## 2022-02-10 DIAGNOSIS — Z6841 Body Mass Index (BMI) 40.0 and over, adult: Secondary | ICD-10-CM | POA: Diagnosis not present

## 2022-02-10 DIAGNOSIS — Z13820 Encounter for screening for osteoporosis: Secondary | ICD-10-CM | POA: Diagnosis not present

## 2022-02-10 DIAGNOSIS — F331 Major depressive disorder, recurrent, moderate: Secondary | ICD-10-CM | POA: Diagnosis not present

## 2022-02-10 DIAGNOSIS — Z0000 Encounter for general adult medical examination without abnormal findings: Secondary | ICD-10-CM | POA: Diagnosis not present

## 2022-02-10 DIAGNOSIS — E7800 Pure hypercholesterolemia, unspecified: Secondary | ICD-10-CM | POA: Diagnosis not present

## 2022-02-10 DIAGNOSIS — Z1231 Encounter for screening mammogram for malignant neoplasm of breast: Secondary | ICD-10-CM | POA: Diagnosis not present

## 2022-02-18 DIAGNOSIS — D519 Vitamin B12 deficiency anemia, unspecified: Secondary | ICD-10-CM | POA: Diagnosis not present

## 2022-02-25 DIAGNOSIS — D519 Vitamin B12 deficiency anemia, unspecified: Secondary | ICD-10-CM | POA: Diagnosis not present

## 2022-03-04 DIAGNOSIS — D510 Vitamin B12 deficiency anemia due to intrinsic factor deficiency: Secondary | ICD-10-CM | POA: Diagnosis not present

## 2022-03-11 DIAGNOSIS — D519 Vitamin B12 deficiency anemia, unspecified: Secondary | ICD-10-CM | POA: Diagnosis not present

## 2022-04-02 DIAGNOSIS — I8311 Varicose veins of right lower extremity with inflammation: Secondary | ICD-10-CM | POA: Diagnosis not present

## 2022-04-02 DIAGNOSIS — M79604 Pain in right leg: Secondary | ICD-10-CM | POA: Diagnosis not present

## 2022-04-08 DIAGNOSIS — D519 Vitamin B12 deficiency anemia, unspecified: Secondary | ICD-10-CM | POA: Diagnosis not present

## 2022-05-01 DIAGNOSIS — H9202 Otalgia, left ear: Secondary | ICD-10-CM | POA: Diagnosis not present

## 2022-05-06 DIAGNOSIS — D519 Vitamin B12 deficiency anemia, unspecified: Secondary | ICD-10-CM | POA: Diagnosis not present

## 2022-05-21 DIAGNOSIS — M25511 Pain in right shoulder: Secondary | ICD-10-CM | POA: Diagnosis not present

## 2022-05-25 DIAGNOSIS — M25511 Pain in right shoulder: Secondary | ICD-10-CM | POA: Diagnosis not present

## 2022-06-03 DIAGNOSIS — D519 Vitamin B12 deficiency anemia, unspecified: Secondary | ICD-10-CM | POA: Diagnosis not present

## 2022-07-03 DIAGNOSIS — R062 Wheezing: Secondary | ICD-10-CM | POA: Diagnosis not present

## 2022-07-03 DIAGNOSIS — R0602 Shortness of breath: Secondary | ICD-10-CM | POA: Diagnosis not present

## 2022-07-03 DIAGNOSIS — J189 Pneumonia, unspecified organism: Secondary | ICD-10-CM | POA: Diagnosis not present

## 2022-10-22 DIAGNOSIS — Z23 Encounter for immunization: Secondary | ICD-10-CM | POA: Diagnosis not present

## 2022-11-15 DIAGNOSIS — J028 Acute pharyngitis due to other specified organisms: Secondary | ICD-10-CM | POA: Diagnosis not present

## 2022-11-15 DIAGNOSIS — U071 COVID-19: Secondary | ICD-10-CM | POA: Diagnosis not present

## 2022-11-15 DIAGNOSIS — J00 Acute nasopharyngitis [common cold]: Secondary | ICD-10-CM | POA: Diagnosis not present

## 2022-11-15 DIAGNOSIS — R8290 Unspecified abnormal findings in urine: Secondary | ICD-10-CM | POA: Diagnosis not present

## 2023-01-05 DIAGNOSIS — N3091 Cystitis, unspecified with hematuria: Secondary | ICD-10-CM | POA: Diagnosis not present

## 2023-01-09 DIAGNOSIS — J069 Acute upper respiratory infection, unspecified: Secondary | ICD-10-CM | POA: Diagnosis not present

## 2023-01-09 DIAGNOSIS — R0981 Nasal congestion: Secondary | ICD-10-CM | POA: Diagnosis not present

## 2023-02-15 DIAGNOSIS — M1990 Unspecified osteoarthritis, unspecified site: Secondary | ICD-10-CM | POA: Diagnosis not present

## 2023-02-15 DIAGNOSIS — Z0000 Encounter for general adult medical examination without abnormal findings: Secondary | ICD-10-CM | POA: Diagnosis not present

## 2023-02-15 DIAGNOSIS — E7800 Pure hypercholesterolemia, unspecified: Secondary | ICD-10-CM | POA: Diagnosis not present

## 2023-02-15 DIAGNOSIS — Z6841 Body Mass Index (BMI) 40.0 and over, adult: Secondary | ICD-10-CM | POA: Diagnosis not present

## 2023-02-15 DIAGNOSIS — F331 Major depressive disorder, recurrent, moderate: Secondary | ICD-10-CM | POA: Diagnosis not present

## 2023-02-15 DIAGNOSIS — Z79899 Other long term (current) drug therapy: Secondary | ICD-10-CM | POA: Diagnosis not present

## 2023-02-15 DIAGNOSIS — I82811 Embolism and thrombosis of superficial veins of right lower extremities: Secondary | ICD-10-CM | POA: Diagnosis not present

## 2023-02-24 DIAGNOSIS — Z01419 Encounter for gynecological examination (general) (routine) without abnormal findings: Secondary | ICD-10-CM | POA: Diagnosis not present

## 2023-02-25 DIAGNOSIS — Z1231 Encounter for screening mammogram for malignant neoplasm of breast: Secondary | ICD-10-CM | POA: Diagnosis not present

## 2023-07-08 DIAGNOSIS — Z131 Encounter for screening for diabetes mellitus: Secondary | ICD-10-CM | POA: Diagnosis not present

## 2023-07-13 DIAGNOSIS — L299 Pruritus, unspecified: Secondary | ICD-10-CM | POA: Diagnosis not present

## 2023-07-13 DIAGNOSIS — L219 Seborrheic dermatitis, unspecified: Secondary | ICD-10-CM | POA: Diagnosis not present

## 2023-07-19 DIAGNOSIS — M1711 Unilateral primary osteoarthritis, right knee: Secondary | ICD-10-CM | POA: Diagnosis not present

## 2023-07-19 DIAGNOSIS — M25561 Pain in right knee: Secondary | ICD-10-CM | POA: Diagnosis not present

## 2023-07-19 DIAGNOSIS — G8929 Other chronic pain: Secondary | ICD-10-CM | POA: Diagnosis not present

## 2023-08-03 DIAGNOSIS — R739 Hyperglycemia, unspecified: Secondary | ICD-10-CM | POA: Diagnosis not present

## 2023-08-03 DIAGNOSIS — Z23 Encounter for immunization: Secondary | ICD-10-CM | POA: Diagnosis not present

## 2023-08-03 DIAGNOSIS — Z6841 Body Mass Index (BMI) 40.0 and over, adult: Secondary | ICD-10-CM | POA: Diagnosis not present

## 2023-08-03 DIAGNOSIS — L219 Seborrheic dermatitis, unspecified: Secondary | ICD-10-CM | POA: Diagnosis not present

## 2023-08-03 DIAGNOSIS — I00 Rheumatic fever without heart involvement: Secondary | ICD-10-CM | POA: Diagnosis not present

## 2023-08-03 DIAGNOSIS — E7800 Pure hypercholesterolemia, unspecified: Secondary | ICD-10-CM | POA: Diagnosis not present

## 2023-09-30 DIAGNOSIS — E1165 Type 2 diabetes mellitus with hyperglycemia: Secondary | ICD-10-CM | POA: Diagnosis not present

## 2023-10-20 DIAGNOSIS — H25813 Combined forms of age-related cataract, bilateral: Secondary | ICD-10-CM | POA: Diagnosis not present

## 2023-10-26 DIAGNOSIS — L299 Pruritus, unspecified: Secondary | ICD-10-CM | POA: Diagnosis not present

## 2023-10-26 DIAGNOSIS — L219 Seborrheic dermatitis, unspecified: Secondary | ICD-10-CM | POA: Diagnosis not present
